# Patient Record
Sex: Female | Born: 1980 | Race: White | Hispanic: No | Marital: Single | Smoking: Former smoker
Health system: Southern US, Community
[De-identification: ages and names within clinical notes are randomized; demographics above are authoritative.]

## PROBLEM LIST (undated history)

## (undated) DIAGNOSIS — F419 Anxiety disorder, unspecified: Secondary | ICD-10-CM

## (undated) HISTORY — DX: Anxiety disorder, unspecified: F41.9

---

## 2011-05-05 ENCOUNTER — Emergency Department: Payer: Self-pay | Admitting: Emergency Medicine

## 2011-06-17 ENCOUNTER — Emergency Department: Payer: Self-pay | Admitting: *Deleted

## 2011-06-20 ENCOUNTER — Ambulatory Visit: Payer: Self-pay | Admitting: Urology

## 2011-07-25 ENCOUNTER — Ambulatory Visit: Payer: Self-pay | Admitting: Urology

## 2011-07-26 ENCOUNTER — Ambulatory Visit: Payer: Self-pay | Admitting: Urology

## 2011-08-01 ENCOUNTER — Ambulatory Visit: Payer: Self-pay | Admitting: Urology

## 2013-09-12 IMAGING — CR DG ABDOMEN 1V
1 series · 1 of 1 positions shown · non-contrast
Comparison: none

REASON FOR EXAM: nephrolithiasis
COMMENTS:

PROCEDURE:     DXR - DXR KIDNEY URETER BLADDER  - July 26, 2011  [DATE]
RESULT:     Comparison: 06/20/2011

[supine kub]
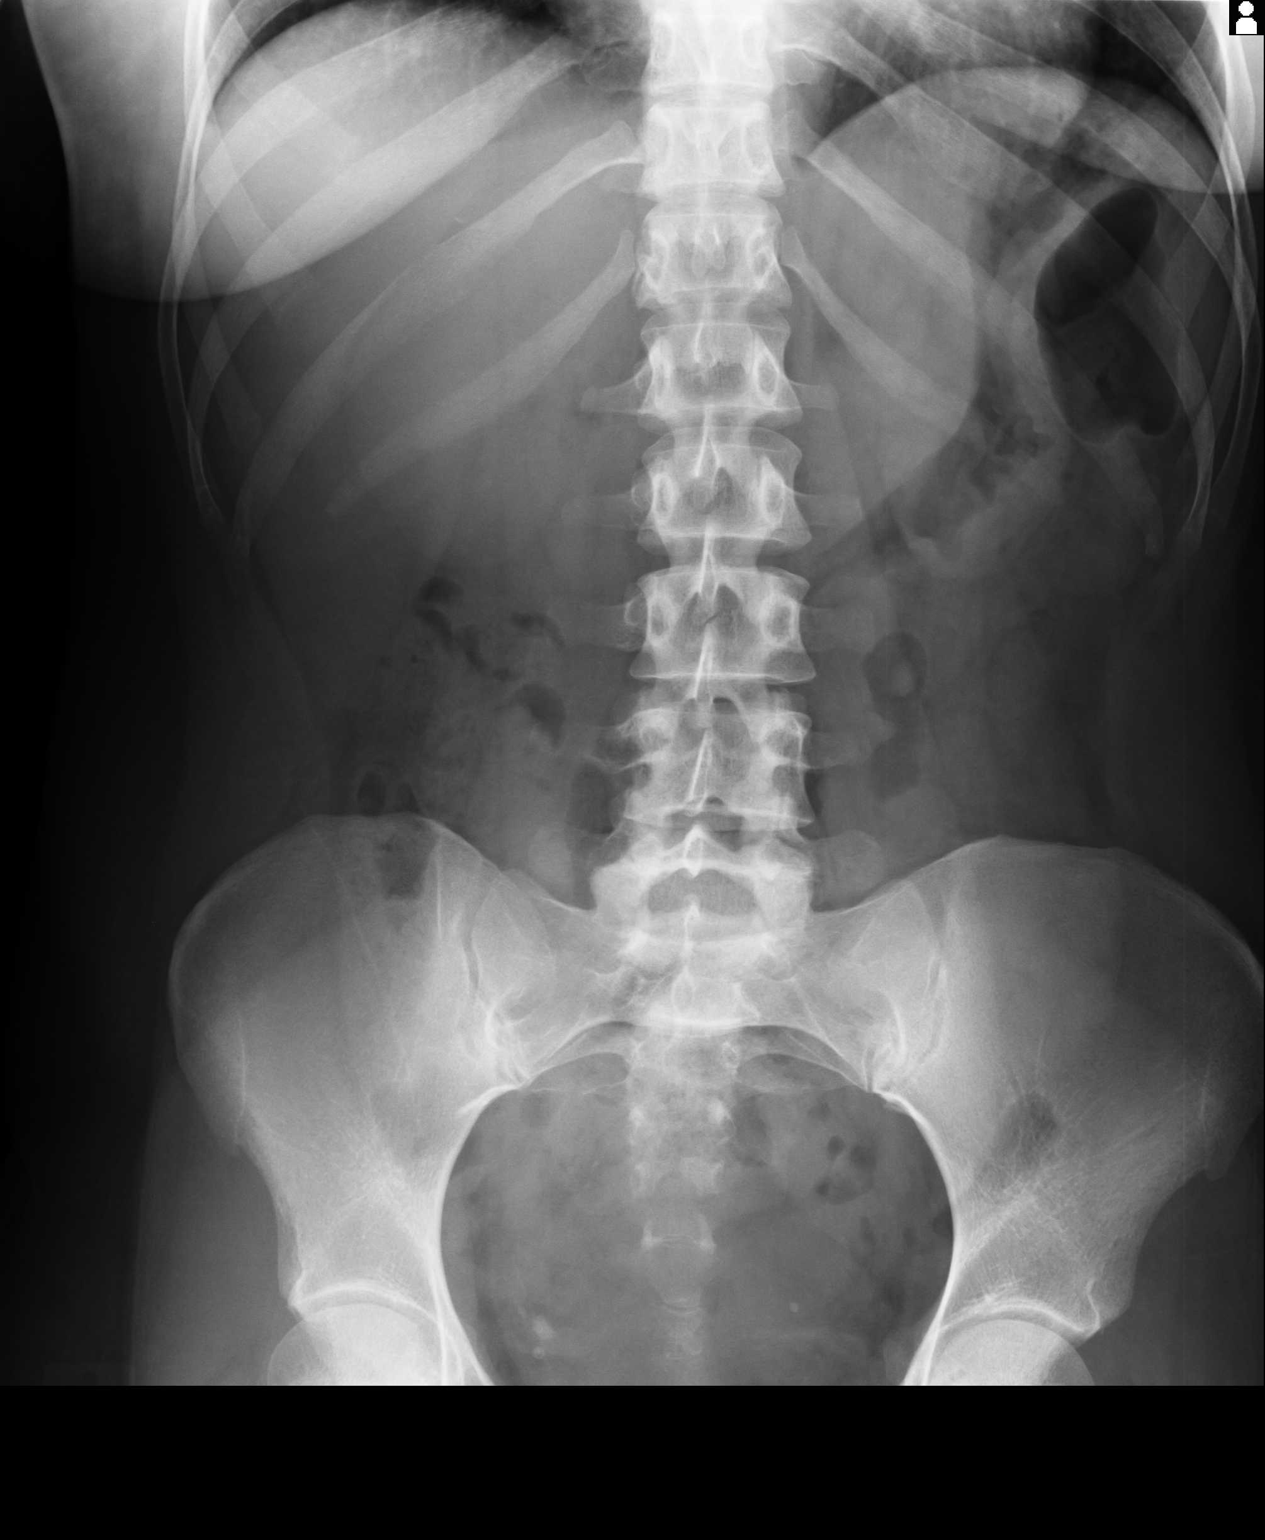

[1 of 1 positions shown; findings below may reference images not displayed]

FINDINGS: There has been slight advancement of the distal ureteral calculus, now
projecting near the ureterovesical junction. Other calcifications in the
pelvis likely represent phleboliths. There may be a tiny calculus in the
inferior pole of the right kidney.
IMPRESSION: Slight interval advancement of the distal right ureteral calculus.

## 2014-12-05 NOTE — Op Note (Signed)
PATIENT NAME:  Denise Sanchez, Denise Sanchez MR#:  [id number] DATE OF BIRTH:  1981/03/28  DATE OF PROCEDURE:  08/01/2011  PREOPERATIVE DIAGNOSIS: Right distal ureteral calculus.   POSTOPERATIVE DIAGNOSIS: Passed ureteral calculus.   PROCEDURES: Right ureteroscopy.   SURGEON: Scott C. Lonna CobbStoioff, MD   ASSISTANT: None.  ANESTHETIC: General.  INDICATION: The patient is a 34 year old female originally seen 06/20/2011 with a 4 x 6 mm right proximal ureteral calculus which was initially diagnosed in September 2012. Because of her work, she has not been able to take off any time and desired ureteroscopic removal. KUB performed last week showed progression of the stone to the distal ureter. She had increased pain yesterday but was not aware of passing a stone.   DESCRIPTION OF PROCEDURE: The patient was taken to the operating room where a general anesthetic was administered. She was placed in the low lithotomy position and her external genitalia were prepped and draped sterilely. Time-out was performed per protocol. A 21 French cystoscope sheath with obturator was lubricated and passed per urethra. Panendoscopy demonstrates normal appearing left ureteral orifice with clear efflux. The right ureteral orifice was edematous, however, efflux was noted. No bladder mucosal lesions were identified. A 0.035 Glidewire was placed with the cystoscope into the right ureteral orifice. Guidewire was passed up to the renal pelvis under fluoroscopic guidance. After passing the guidewire, a stone was not seen on fluoroscopy. The cystoscope was removed and a semirigid ureteroscope was passed per urethra. The right ureteral orifice was easily engaged without dilation. No stone was identified within the distal ureter. The ureteroscope was able to be passed up to the ureteropelvic junction with no abnormalities noted. No stone was seen in the kidney on fluoroscopy. Scope was withdrawn and again no stone or abnormality was noted. Ureteroscope was  removed. Cystoscope was repassed and no stone was seen in the bladder. The guidewire was removed. A ureteral stent was not placed. A B and O suppository was placed per rectum.   The patient was taken to the PAC-U in stable condition. No complications. Estimated blood loss zero.  ____________________________ Verna CzechScott C. Lonna CobbStoioff, MD scs:drc D: 08/01/2011 11:06:12 ET T: 08/01/2011 11:23:58 ET JOB#: [id number]  cc: Lorin PicketScott C. Lonna CobbStoioff, MD, <Dictator> Riki AltesSCOTT C STOIOFF MD ELECTRONICALLY SIGNED 08/29/2011 7:28

## 2024-02-04 ENCOUNTER — Encounter: Payer: Self-pay | Admitting: General Practice

## 2024-02-04 ENCOUNTER — Ambulatory Visit: Admitting: General Practice

## 2024-02-04 VITALS — BP 138/80 | HR 77 | Temp 98.2°F | Ht 61.5 in | Wt 148.0 lb

## 2024-02-04 DIAGNOSIS — Z833 Family history of diabetes mellitus: Secondary | ICD-10-CM | POA: Insufficient documentation

## 2024-02-04 DIAGNOSIS — Z7689 Persons encountering health services in other specified circumstances: Secondary | ICD-10-CM | POA: Insufficient documentation

## 2024-02-04 DIAGNOSIS — Z1159 Encounter for screening for other viral diseases: Secondary | ICD-10-CM

## 2024-02-04 DIAGNOSIS — Z0000 Encounter for general adult medical examination without abnormal findings: Secondary | ICD-10-CM | POA: Diagnosis not present

## 2024-02-04 DIAGNOSIS — Z114 Encounter for screening for human immunodeficiency virus [HIV]: Secondary | ICD-10-CM | POA: Diagnosis not present

## 2024-02-04 DIAGNOSIS — Z124 Encounter for screening for malignant neoplasm of cervix: Secondary | ICD-10-CM | POA: Insufficient documentation

## 2024-02-04 DIAGNOSIS — Z1231 Encounter for screening mammogram for malignant neoplasm of breast: Secondary | ICD-10-CM

## 2024-02-04 LAB — LIPID PANEL
Cholesterol: 230 mg/dL — ABNORMAL HIGH (ref 0–200)
HDL: 71.1 mg/dL (ref >39.00)
LDL Cholesterol: 146 mg/dL — ABNORMAL HIGH (ref 0–99)
NonHDL: 158.65
Total CHOL/HDL Ratio: 3
Triglycerides: 64 mg/dL (ref 0.0–149.0)
VLDL: 12.8 mg/dL (ref 0.0–40.0)

## 2024-02-04 LAB — COMPREHENSIVE METABOLIC PANEL WITH GFR
ALT: 44 U/L — ABNORMAL HIGH (ref 0–35)
AST: 36 U/L (ref 0–37)
Albumin: 4.9 g/dL (ref 3.5–5.2)
Alkaline Phosphatase: 63 U/L (ref 39–117)
BUN: 8 mg/dL (ref 6–23)
CO2: 27 meq/L (ref 19–32)
Calcium: 9.6 mg/dL (ref 8.4–10.5)
Chloride: 102 meq/L (ref 96–112)
Creatinine, Ser: 0.68 mg/dL (ref 0.40–1.20)
GFR: 107.14 mL/min (ref >60.00)
Glucose, Bld: 89 mg/dL (ref 70–99)
Potassium: 4 meq/L (ref 3.5–5.1)
Sodium: 138 meq/L (ref 135–145)
Total Bilirubin: 1.1 mg/dL (ref 0.2–1.2)
Total Protein: 7.6 g/dL (ref 6.0–8.3)

## 2024-02-04 LAB — CBC
HCT: 39.4 % (ref 36.0–46.0)
Hemoglobin: 13.2 g/dL (ref 12.0–15.0)
MCHC: 33.6 g/dL (ref 30.0–36.0)
MCV: 84.1 fl (ref 78.0–100.0)
Platelets: 389 10*3/uL (ref 150.0–400.0)
RBC: 4.68 Mil/uL (ref 3.87–5.11)
RDW: 14 % (ref 11.5–15.5)
WBC: 8.3 10*3/uL (ref 4.0–10.5)

## 2024-02-04 LAB — TSH: TSH: 1.88 u[IU]/mL (ref 0.35–5.50)

## 2024-02-04 LAB — HEMOGLOBIN A1C: Hgb A1c MFr Bld: 5.3 % (ref 4.6–6.5)

## 2024-02-04 NOTE — Patient Instructions (Addendum)
 Stop by the lab prior to leaving today. I will notify you of your results once received.   You will either be contacted via phone regarding your referral to gynecology,  or you may receive a letter on your MyChart portal from our referral team with instructions for scheduling an appointment. Please let us  know if you have not been contacted by anyone within two weeks.  Call and schedule mammogram appointment.   It was a pleasure to meet you today! Please don't hesitate to contact me with any questions. Welcome to [facility]!

## 2024-02-04 NOTE — Assessment & Plan Note (Signed)
 Immunizations UTD. Pap smear due Mammogram due, orders placed. Colonoscopy  due   Discussed the importance of a healthy diet and regular exercise in order for weight loss, and to reduce the risk of further co-morbidity.  Exam stable. Labs pending.  Follow up in 1 year for repeat physical.

## 2024-02-04 NOTE — Assessment & Plan Note (Signed)
 Referral placed for gyn for pap smear.

## 2024-02-04 NOTE — Progress Notes (Signed)
 New Patient Office Visit  Subjective    Patient ID: Denise Sanchez, female    DOB: 10/07/1980  Age: 43 y.o. MRN: [id number]  CC:  Chief Complaint  Patient presents with   New Patient (Initial Visit)   Annual Exam    HPI Denise Sanchez is a 43 y.o. female presents to establish care, complete physical and follow up of chronic conditions..  Last PCP/physical/labs: several years ago; never had any blood work.   Immunizations: -Tetanus: Completed in 2024 -Influenza: never  Diet: Fair diet.  Exercise:  regular exercise - 30 mins a day with indoor bike, treadmill 4-5 times day.   Eye exam: Completes annually  Dental exam: Completes semi-annually    Pap Smear: never Mammogram: never  Family hx of rectal cancer with maternal grandmother.  Family hx of type 2 DM on maternal side.   Outpatient Encounter Medications as of 02/04/2024  Medication Sig   levocetirizine (XYZAL) 5 MG tablet Take 5 mg by mouth every evening.   Multiple Vitamin (MULTI VITAMIN PO) Take by mouth.   omeprazole (PRILOSEC) 20 MG capsule Take 20 mg by mouth daily.   Probiotic Product (PROBIOTIC BLEND PO) Take by mouth.   No facility-administered encounter medications on file as of 02/04/2024.    Past Medical History:  Diagnosis Date   Anxiety     History reviewed. No pertinent surgical history.  Family History  Problem Relation Age of Onset   Alcohol abuse Father    Alcohol abuse Maternal Grandfather    Heart disease Maternal Grandfather     Social History   Socioeconomic History   Marital status: Single    Spouse name: Not on file   Number of children: Not on file   Years of education: Not on file   Highest education level: Some college, no degree  Occupational History   Not on file  Tobacco Use   Smoking status: Former    Current packs/day: 0.00    Types: Cigarettes    Quit date: 09/23/2009    Years since quitting: 14.3   Smokeless tobacco: Never  Vaping Use   Vaping status: Never Used   Substance and Sexual Activity   Alcohol use: Not Currently   Drug use: Not Currently    Types: Marijuana   Sexual activity: Not Currently    Birth control/protection: None  Other Topics Concern   Not on file  Social History Narrative   Not on file   Social Drivers of Health   Financial Resource Strain: Low Risk  (02/03/2024)   Overall Financial Resource Strain (CARDIA)    Difficulty of Paying Living Expenses: Not very hard  Food Insecurity: No Food Insecurity (02/03/2024)   Hunger Vital Sign    Worried About Running Out of Food in the Last Year: Never true    Ran Out of Food in the Last Year: Never true  Transportation Needs: No Transportation Needs (02/03/2024)   PRAPARE - Administrator, Civil Service (Medical): No    Lack of Transportation (Non-Medical): No  Physical Activity: Sufficiently Active (02/03/2024)   Exercise Vital Sign    Days of Exercise per Week: 5 days    Minutes of Exercise per Session: 30 min  Stress: Stress Concern Present (02/03/2024)   [employer] of Occupational Health - Occupational Stress Questionnaire    Feeling of Stress: To some extent  Social Connections: Socially Isolated (02/03/2024)   Social Connection and Isolation Panel    Frequency of Communication with Friends  and Family: More than three times a week    Frequency of Social Gatherings with Friends and Family: Once a week    Attends Religious Services: Never    Database administrator or Organizations: No    Attends Engineer, structural: Not on file    Marital Status: Never married  Intimate Partner Violence: Not on file    Review of Systems  Constitutional:  Negative for chills, fever, malaise/fatigue and weight loss.  HENT:  Negative for congestion, ear discharge, ear pain, hearing loss, nosebleeds, sinus pain, sore throat and tinnitus.   Eyes:  Negative for blurred vision, double vision, pain, discharge and redness.  Respiratory:  Negative for cough, shortness of  breath, wheezing and stridor.   Cardiovascular:  Negative for chest pain, palpitations and leg swelling.  Gastrointestinal:  Negative for abdominal pain, constipation, diarrhea, heartburn, nausea and vomiting.  Genitourinary:  Negative for dysuria, frequency and urgency.  Musculoskeletal:  Negative for myalgias.  Skin:  Negative for rash.  Neurological:  Negative for dizziness, tingling, seizures, weakness and headaches.  Psychiatric/Behavioral:  Negative for depression, substance abuse and suicidal ideas. The patient is not nervous/anxious.       Objective    BP 138/80   Pulse 77   Temp 98.2 F (36.8 C) (Oral)   Ht 5' 1.5 (1.562 m)   Wt 148 lb (67.1 kg)   LMP 01/31/2024 (Within Days)   SpO2 98%   BMI 27.51 kg/m   Physical Exam Vitals and nursing note reviewed.  Constitutional:      Appearance: Normal appearance.  HENT:     Head: Normocephalic and atraumatic.     Right Ear: Tympanic membrane, ear canal and external ear normal.     Left Ear: Tympanic membrane, ear canal and external ear normal.     Nose: Nose normal.     Mouth/Throat:     Mouth: Mucous membranes are moist.     Pharynx: Oropharynx is clear.   Eyes:     Conjunctiva/sclera: Conjunctivae normal.     Pupils: Pupils are equal, round, and reactive to light.    Cardiovascular:     Rate and Rhythm: Normal rate and regular rhythm.     Pulses: Normal pulses.     Heart sounds: Normal heart sounds.  Pulmonary:     Effort: Pulmonary effort is normal.     Breath sounds: Normal breath sounds.  Abdominal:     General: Abdomen is flat. Bowel sounds are normal.     Palpations: Abdomen is soft.   Musculoskeletal:        General: Normal range of motion.     Cervical back: Normal range of motion.   Skin:    General: Skin is warm and dry.     Capillary Refill: Capillary refill takes less than 2 seconds.   Neurological:     General: No focal deficit present.     Mental Status: She is alert and oriented to  person, place, and time. Mental status is at baseline.   Psychiatric:        Mood and Affect: Mood normal.        Behavior: Behavior normal.        Thought Content: Thought content normal.        Judgment: Judgment normal.         Assessment & Plan:  Encounter for screening and preventative care Assessment & Plan: Immunizations UTD. Pap smear due Mammogram due, orders placed. Colonoscopy  due  Discussed the importance of a healthy diet and regular exercise in order for weight loss, and to reduce the risk of further co-morbidity.  Exam stable. Labs pending.  Follow up in 1 year for repeat physical.  Orders: -     CBC -     Comprehensive metabolic panel with GFR -     Lipid panel -     TSH  Establishing care with new doctor, encounter for Assessment & Plan: EMR reviewed briefly.    Screening for HIV (human immunodeficiency virus) -     HIV Antibody (routine testing w rflx)  Encounter for hepatitis C screening test for low risk patient -     Hepatitis C antibody  Screening for cervical cancer Assessment & Plan: Referral placed for gyn for pap smear.   Orders: -     Ambulatory referral to Gynecology  Family history of diabetes mellitus (DM) Assessment & Plan: Hemoglobin A1c pending.  Orders: -     Hemoglobin A1c  Encounter for screening mammogram for malignant neoplasm of breast -     3D Screening Mammogram, Left and Right; Future    Return in about 1 year (around 02/03/2025) for physical .   Carrol Aurora, NP

## 2024-02-04 NOTE — Assessment & Plan Note (Signed)
 EMR reviewed briefly.

## 2024-02-04 NOTE — Assessment & Plan Note (Signed)
 Hemoglobin A1c pending

## 2024-02-05 ENCOUNTER — Ambulatory Visit: Payer: Self-pay | Admitting: General Practice

## 2024-02-05 LAB — HIV ANTIBODY (ROUTINE TESTING W REFLEX): HIV 1&2 Ab, 4th Generation: NONREACTIVE

## 2024-02-05 LAB — HEPATITIS C ANTIBODY: Hepatitis C Ab: NONREACTIVE

## 2024-02-07 NOTE — Telephone Encounter (Signed)
 Spoke to pt, sch labs for 05/11/24 & ov for 08/17/24

## 2024-03-15 NOTE — Progress Notes (Unsigned)
 Vincente Shivers, NP   No chief complaint on file.   HPI:      Ms. Denise Sanchez is a 43 y.o. No obstetric history on file. whose LMP was No LMP recorded., presents today for NP pap smear only, referred by PCP. Annual done with PCP.   Order for mammo placed with PCP  Patient Active Problem List   Diagnosis Date Noted   Establishing care with new doctor, encounter for 02/04/2024   Encounter for screening and preventative care 02/04/2024   Screening for cervical cancer 02/04/2024   Family history of diabetes mellitus (DM) 02/04/2024    No past surgical history on file.  Family History  Problem Relation Age of Onset   Alcohol abuse Father    Alcohol abuse Maternal Grandfather    Heart disease Maternal Grandfather     Social History   Socioeconomic History   Marital status: Single    Spouse name: Not on file   Number of children: Not on file   Years of education: Not on file   Highest education level: Some college, no degree  Occupational History   Not on file  Tobacco Use   Smoking status: Former    Current packs/day: 0.00    Types: Cigarettes    Quit date: 09/23/2009    Years since quitting: 14.4   Smokeless tobacco: Never  Vaping Use   Vaping status: Never Used  Substance and Sexual Activity   Alcohol use: Not Currently   Drug use: Not Currently    Types: Marijuana   Sexual activity: Not Currently    Birth control/protection: None  Other Topics Concern   Not on file  Social History Narrative   Not on file   Social Drivers of Health   Financial Resource Strain: Low Risk  (02/03/2024)   Overall Financial Resource Strain (CARDIA)    Difficulty of Paying Living Expenses: Not very hard  Food Insecurity: No Food Insecurity (02/03/2024)   Hunger Vital Sign    Worried About Running Out of Food in the Last Year: Never true    Ran Out of Food in the Last Year: Never true  Transportation Needs: No Transportation Needs (02/03/2024)   PRAPARE - Therapist, art (Medical): No    Lack of Transportation (Non-Medical): No  Physical Activity: Sufficiently Active (02/03/2024)   Exercise Vital Sign    Days of Exercise per Week: 5 days    Minutes of Exercise per Session: 30 min  Stress: Stress Concern Present (02/03/2024)   [employer] of Occupational Health - Occupational Stress Questionnaire    Feeling of Stress: To some extent  Social Connections: Socially Isolated (02/03/2024)   Social Connection and Isolation Panel    Frequency of Communication with Friends and Family: More than three times a week    Frequency of Social Gatherings with Friends and Family: Once a week    Attends Religious Services: Never    Database administrator or Organizations: No    Attends Engineer, structural: Not on file    Marital Status: Never married  Intimate Partner Violence: Not on file    Outpatient Medications Prior to Visit  Medication Sig Dispense Refill   levocetirizine (XYZAL) 5 MG tablet Take 5 mg by mouth every evening.     Multiple Vitamin (MULTI VITAMIN PO) Take by mouth.     omeprazole (PRILOSEC) 20 MG capsule Take 20 mg by mouth daily.     Probiotic Product (  PROBIOTIC BLEND PO) Take by mouth.     No facility-administered medications prior to visit.      ROS:  Review of Systems BREAST: No symptoms   OBJECTIVE:   Vitals:  There were no vitals taken for this visit.  Physical Exam  Results: No results found for this or any previous visit (from the past 24 hours).   Assessment/Plan: No diagnosis found.    No orders of the defined types were placed in this encounter.     No follow-ups on file.  Zamiya Dillard B. Anderson Coppock, PA-C 03/15/2024 9:39 AM

## 2024-03-16 ENCOUNTER — Encounter: Payer: Self-pay | Admitting: Obstetrics and Gynecology

## 2024-03-16 ENCOUNTER — Ambulatory Visit: Admitting: Obstetrics and Gynecology

## 2024-03-16 ENCOUNTER — Other Ambulatory Visit (HOSPITAL_COMMUNITY)
Admission: RE | Admit: 2024-03-16 | Discharge: 2024-03-16 | Disposition: A | Discharge status: Home / Self Care | Source: Ambulatory Visit | Attending: Obstetrics and Gynecology | Admitting: Obstetrics and Gynecology

## 2024-03-16 VITALS — BP 130/85 | HR 76 | Ht 61.5 in | Wt 143.0 lb

## 2024-03-16 DIAGNOSIS — Z124 Encounter for screening for malignant neoplasm of cervix: Secondary | ICD-10-CM | POA: Insufficient documentation

## 2024-03-16 DIAGNOSIS — F3281 Premenstrual dysphoric disorder: Secondary | ICD-10-CM

## 2024-03-16 DIAGNOSIS — Z1151 Encounter for screening for human papillomavirus (HPV): Secondary | ICD-10-CM | POA: Insufficient documentation

## 2024-03-16 DIAGNOSIS — N939 Abnormal uterine and vaginal bleeding, unspecified: Secondary | ICD-10-CM | POA: Diagnosis not present

## 2024-03-16 NOTE — Patient Instructions (Signed)
 I value your feedback and you entrusting [place] with your care. If you get a [facility] patient survey, I would appreciate you taking the time to let us know about your experience today. Thank you! ? ? ?

## 2024-03-19 LAB — CYTOLOGY - PAP
Comment: NEGATIVE
Diagnosis: NEGATIVE
High risk HPV: NEGATIVE

## 2024-04-07 ENCOUNTER — Other Ambulatory Visit

## 2024-04-29 ENCOUNTER — Other Ambulatory Visit: Payer: Self-pay | Admitting: General Practice

## 2024-04-29 ENCOUNTER — Ambulatory Visit

## 2024-04-29 DIAGNOSIS — N939 Abnormal uterine and vaginal bleeding, unspecified: Secondary | ICD-10-CM | POA: Diagnosis not present

## 2024-04-29 DIAGNOSIS — R7989 Other specified abnormal findings of blood chemistry: Secondary | ICD-10-CM

## 2024-04-30 ENCOUNTER — Telehealth: Payer: Self-pay | Admitting: Obstetrics and Gynecology

## 2024-04-30 DIAGNOSIS — N760 Acute vaginitis: Secondary | ICD-10-CM

## 2024-04-30 MED ORDER — METRONIDAZOLE 500 MG PO TABS: ORAL_TABLET | ORAL | 0 refills | Status: DC

## 2024-04-30 NOTE — Telephone Encounter (Signed)
 Pt aware of Gyn u/s results and leio. Tx options discussed, pt wants to follow for now.  Also discussed BV flora shift on pap smear; has since started to have vag d/c and odor. Rx flagyl  eRxd, no EtOH. F/u prn.

## 2024-05-11 ENCOUNTER — Ambulatory Visit: Payer: Self-pay | Admitting: General Practice

## 2024-05-11 ENCOUNTER — Other Ambulatory Visit

## 2024-05-11 DIAGNOSIS — R7989 Other specified abnormal findings of blood chemistry: Secondary | ICD-10-CM

## 2024-05-11 LAB — HEPATIC FUNCTION PANEL
ALT: 13 U/L (ref 0–35)
AST: 15 U/L (ref 0–37)
Albumin: 4.5 g/dL (ref 3.5–5.2)
Alkaline Phosphatase: 59 U/L (ref 39–117)
Bilirubin, Direct: 0.2 mg/dL (ref 0.0–0.3)
Total Bilirubin: 1 mg/dL (ref 0.2–1.2)
Total Protein: 7.2 g/dL (ref 6.0–8.3)

## 2024-07-01 ENCOUNTER — Telehealth: Payer: Self-pay

## 2024-07-01 DIAGNOSIS — N939 Abnormal uterine and vaginal bleeding, unspecified: Secondary | ICD-10-CM

## 2024-07-01 DIAGNOSIS — F3281 Premenstrual dysphoric disorder: Secondary | ICD-10-CM

## 2024-07-01 MED ORDER — DROSPIRENONE-ETHINYL ESTRADIOL 3-0.02 MG PO TABS: 1.0000 | ORAL_TABLET | Freq: Every day | ORAL | 3 refills | Status: AC

## 2024-07-01 NOTE — Telephone Encounter (Signed)
 Patient called she is considering Slynd as an options for treatment for abnormal uterine bleeding. She would like to discuss options with you. She would like to do a telephone visit if necessary, she is unsure if Slynd is right for her, she does not want anything that is going to make her gain weight.

## 2024-07-01 NOTE — Telephone Encounter (Signed)
 Spoke with pt. Will start Rx yaz, no contraindications to OCPs. Will do better for cycle control and PMDD sx. Rx eRxd, start with next menses, not sexually active. F/u prn.

## 2024-08-17 ENCOUNTER — Ambulatory Visit: Payer: Self-pay | Admitting: General Practice

## 2024-08-17 ENCOUNTER — Ambulatory Visit: Admitting: General Practice

## 2024-08-17 ENCOUNTER — Encounter: Payer: Self-pay | Admitting: General Practice

## 2024-08-17 VITALS — BP 138/100 | HR 90 | Temp 98.6°F | Ht 61.5 in | Wt 143.6 lb

## 2024-08-17 DIAGNOSIS — R7989 Other specified abnormal findings of blood chemistry: Secondary | ICD-10-CM | POA: Diagnosis not present

## 2024-08-17 DIAGNOSIS — E782 Mixed hyperlipidemia: Secondary | ICD-10-CM

## 2024-08-17 DIAGNOSIS — R5383 Other fatigue: Secondary | ICD-10-CM | POA: Diagnosis not present

## 2024-08-17 DIAGNOSIS — R030 Elevated blood-pressure reading, without diagnosis of hypertension: Secondary | ICD-10-CM | POA: Insufficient documentation

## 2024-08-17 LAB — HEMOGLOBIN A1C: Hgb A1c MFr Bld: 5.2 % (ref 4.6–6.5)

## 2024-08-17 LAB — COMPREHENSIVE METABOLIC PANEL WITH GFR
ALT: 9 U/L (ref 3–35)
AST: 13 U/L (ref 5–37)
Albumin: 4.5 g/dL (ref 3.5–5.2)
Alkaline Phosphatase: 43 U/L (ref 39–117)
BUN: 11 mg/dL (ref 6–23)
CO2: 26 meq/L (ref 19–32)
Calcium: 9.1 mg/dL (ref 8.4–10.5)
Chloride: 106 meq/L (ref 96–112)
Creatinine, Ser: 0.68 mg/dL (ref 0.40–1.20)
GFR: 106.74 mL/min
Glucose, Bld: 88 mg/dL (ref 70–99)
Potassium: 4.4 meq/L (ref 3.5–5.1)
Sodium: 139 meq/L (ref 135–145)
Total Bilirubin: 0.7 mg/dL (ref 0.2–1.2)
Total Protein: 7.4 g/dL (ref 6.0–8.3)

## 2024-08-17 LAB — VITAMIN B12: Vitamin B-12: 232 pg/mL (ref 211–911)

## 2024-08-17 LAB — VITAMIN D 25 HYDROXY (VIT D DEFICIENCY, FRACTURES): VITD: 37.2 ng/mL (ref 30.00–100.00)

## 2024-08-17 LAB — CBC
HCT: 37.5 % (ref 36.0–46.0)
Hemoglobin: 12.7 g/dL (ref 12.0–15.0)
MCHC: 33.8 g/dL (ref 30.0–36.0)
MCV: 85 fl (ref 78.0–100.0)
Platelets: 352 K/uL (ref 150.0–400.0)
RBC: 4.42 Mil/uL (ref 3.87–5.11)
RDW: 14.2 % (ref 11.5–15.5)
WBC: 7.5 K/uL (ref 4.0–10.5)

## 2024-08-17 LAB — LIPID PANEL
Cholesterol: 220 mg/dL — ABNORMAL HIGH (ref 28–200)
HDL: 71 mg/dL
LDL Cholesterol: 129 mg/dL — ABNORMAL HIGH (ref 10–99)
NonHDL: 149.49
Total CHOL/HDL Ratio: 3
Triglycerides: 104 mg/dL (ref 10.0–149.0)
VLDL: 20.8 mg/dL (ref 0.0–40.0)

## 2024-08-17 LAB — TSH: TSH: 0.53 u[IU]/mL (ref 0.35–5.50)

## 2024-08-17 NOTE — Progress Notes (Signed)
 "  Established Patient Office Visit  Subjective   Patient ID: Denise Sanchez, female    DOB: 1981-05-14  Age: 44 y.o. MRN: [id number]  Chief Complaint  Patient presents with   Hyperlipidemia    Patient here to follow up on cholesterol with fasting labs    Hyperlipidemia Pertinent negatives include no chest pain or shortness of breath.    Discussed the use of AI scribe software for clinical note transcription with the patient, who gave verbal consent to proceed.  History of Present Illness Denise Sanchez is a 44 year old female who presents for follow-up on cholesterol levels and elevated liver function tests.  She has been monitoring her cholesterol levels after they were found to be borderline high during a previous physical exam. Her total cholesterol was 230 mg/dL, with an LDL of 853 mg/dL, HDL of 71 mg/dL, and triglycerides of 64 mg/dL. She attempted dietary changes, including going gluten-free and dairy-free, and reducing sugar intake, but found it challenging to maintain these changes, especially during the holiday season. She feels unwell, has gained weight, experiences poor sleep, and is out of breath due to a lack of exercise.  Her liver function tests previously showed an elevated ALT, which returned to normal upon recheck. She does not consume alcohol and rarely uses ibuprofen, having taken it only twice since her last visit.   She experiences fatigue, poor sleep, and night sweats, which she attributes to hormonal changes. She started a new birth control pill, Yaz, in November, which she believes may be contributing to her symptoms. She experiences sleep disturbances, waking up after two hours and sweating profusely.  She is currently taking over-the-counter vitamin D  daily.     Patient Active Problem List   Diagnosis Date Noted   Elevated blood pressure reading 08/17/2024   Mixed hyperlipidemia 08/17/2024   Abnormal LFTs 08/17/2024   Fatigue 08/17/2024   Establishing care  with new doctor, encounter for 02/04/2024   Encounter for screening and preventative care 02/04/2024   Screening for cervical cancer 02/04/2024   Family history of diabetes mellitus (DM) 02/04/2024   Past Medical History:  Diagnosis Date   Anxiety    Past Surgical History:  Procedure Laterality Date   OTHER SURGICAL HISTORY     Oral Surgery   Allergies[1]       08/17/2024    9:35 AM 02/04/2024    2:30 PM  Depression screen PHQ 2/9  Decreased Interest 0 0  Down, Depressed, Hopeless 0 1  PHQ - 2 Score 0 1  Altered sleeping 3 1  Tired, decreased energy 1 1  Change in appetite 0 0  Feeling bad or failure about yourself  0 0  Trouble concentrating 0 0  Moving slowly or fidgety/restless 0 0  Suicidal thoughts 0 0  PHQ-9 Score 4 3   Difficult doing work/chores Somewhat difficult Not difficult at all     Data saved with a previous flowsheet row definition       08/17/2024    9:36 AM 02/04/2024    2:31 PM  GAD 7 : Generalized Anxiety Score  Nervous, Anxious, on Edge 1 1  Control/stop worrying 1 1  Worry too much - different things 1 1  Trouble relaxing 1 1  Restless 0 0  Easily annoyed or irritable 1 1  Afraid - awful might happen 0 0  Total GAD 7 Score 5 5  Anxiety Difficulty Somewhat difficult Not difficult at all      Review of  Systems  Constitutional:  Negative for chills and fever.  Respiratory:  Negative for shortness of breath.   Cardiovascular:  Negative for chest pain.  Gastrointestinal:  Negative for abdominal pain, constipation, diarrhea, heartburn, nausea and vomiting.  Genitourinary:  Negative for dysuria, frequency and urgency.  Neurological:  Negative for dizziness and headaches.  Endo/Heme/Allergies:  Negative for polydipsia.  Psychiatric/Behavioral:  Negative for depression and suicidal ideas. The patient is not nervous/anxious.       Objective:     BP (!) 138/100   Pulse 90   Temp 98.6 F (37 C) (Temporal)   Ht 5' 1.5 (1.562 m)   Wt 143 lb  9.6 oz (65.1 kg)   LMP 08/14/2024 (Exact Date)   SpO2 99%   BMI 26.69 kg/m  BP Readings from Last 3 Encounters:  08/17/24 (!) 138/100  03/16/24 130/85  02/04/24 138/80   Wt Readings from Last 3 Encounters:  08/17/24 143 lb 9.6 oz (65.1 kg)  03/16/24 143 lb (64.9 kg)  02/04/24 148 lb (67.1 kg)      Physical Exam Vitals and nursing note reviewed.  Constitutional:      Appearance: Normal appearance.  Cardiovascular:     Rate and Rhythm: Normal rate and regular rhythm.     Pulses: Normal pulses.     Heart sounds: Normal heart sounds.  Pulmonary:     Effort: Pulmonary effort is normal.     Breath sounds: Normal breath sounds.  Neurological:     Mental Status: She is alert and oriented to person, place, and time.  Psychiatric:        Mood and Affect: Mood normal.        Behavior: Behavior normal.        Thought Content: Thought content normal.        Judgment: Judgment normal.      Results for orders placed or performed in visit on 08/17/24  Lipid panel  Result Value Ref Range   Cholesterol 220 (H) 28 - 200 mg/dL   Triglycerides 895.9 89.9 - 149.0 mg/dL   HDL 28.99 >60.99 mg/dL   VLDL 79.1 0.0 - 59.9 mg/dL   LDL Cholesterol 870 (H) 10 - 99 mg/dL   Total CHOL/HDL Ratio 3    NonHDL 149.49   VITAMIN D  25 Hydroxy (Vit-D Deficiency, Fractures)  Result Value Ref Range   VITD 37.20 30.00 - 100.00 ng/mL  TSH  Result Value Ref Range   TSH 0.53 0.35 - 5.50 uIU/mL  Hemoglobin A1c  Result Value Ref Range   Hgb A1c MFr Bld 5.2 4.6 - 6.5 %  Vitamin B12  Result Value Ref Range   Vitamin B-12 232 211 - 911 pg/mL  CBC  Result Value Ref Range   WBC 7.5 4.0 - 10.5 K/uL   RBC 4.42 3.87 - 5.11 Mil/uL   Platelets 352.0 150.0 - 400.0 K/uL   Hemoglobin 12.7 12.0 - 15.0 g/dL   HCT 62.4 63.9 - 53.9 %   MCV 85.0 78.0 - 100.0 fl   MCHC 33.8 30.0 - 36.0 g/dL   RDW 85.7 88.4 - 84.4 %  Comprehensive metabolic panel with GFR  Result Value Ref Range   Sodium 139 135 - 145 mEq/L    Potassium 4.4 3.5 - 5.1 mEq/L   Chloride 106 96 - 112 mEq/L   CO2 26 19 - 32 mEq/L   Glucose, Bld 88 70 - 99 mg/dL   BUN 11 6 - 23 mg/dL   Creatinine, Ser 9.31 0.40 -  1.20 mg/dL   Total Bilirubin 0.7 0.2 - 1.2 mg/dL   Alkaline Phosphatase 43 39 - 117 U/L   AST 13 5 - 37 U/L   ALT 9 3 - 35 U/L   Total Protein 7.4 6.0 - 8.3 g/dL   Albumin 4.5 3.5 - 5.2 g/dL   GFR 893.25 >39.99 mL/min   Calcium 9.1 8.4 - 10.5 mg/dL       The 89-bzjm ASCVD risk score (Arnett DK, et al., 2019) is: 0.6%    Assessment & Plan:  Mixed hyperlipidemia -     Lipid panel  Abnormal LFTs  Fatigue, unspecified type -     VITAMIN D  25 Hydroxy (Vit-D Deficiency, Fractures) -     TSH -     Hemoglobin A1c -     Vitamin B12 -     CBC -     Comprehensive metabolic panel with GFR  Elevated blood pressure reading    Assessment and Plan Assessment & Plan Mixed hyperlipidemia LDL elevated at 146 mg/dL, total cholesterol borderline high at 230 mg/dL. HDL excellent, triglycerides normal. Prefers dietary management over medication. - Repeated fasting lipid panel. - Continue dietary modifications to lower LDL. - Consider dietitian referral if dietary management is challenging.  Abnormal liver function tests Previous ALT elevation normalized. No alcohol or excessive acetaminophen use. Possible dietary factor involvement. - Rechecked liver function tests to ensure ALT remains normal. - Consider liver ultrasound if ALT remains elevated.  Fatigue Fatigue, poor sleep, weight gain possibly due to hormonal changes or dietary habits. On Yaz, vitamin D  supplementation ongoing. Differential includes hormonal imbalance, vitamin deficiencies. - Checked A1c for prediabetes. - Checked vitamin D  and B12 levels. - Continue vitamin D  supplementation. - Discuss symptoms with OB/GYN for hormonal evaluation.  Elevated blood pressure First-time elevation, possibly due to anxiety or stress. Previous readings slightly elevated  but not concerning. - both readings elevated today.  - Patient is asked to monitor BP at home or work, several times per month and return with written values at next office visit.  - Advised on dietary sodium reduction. - Consider dietitian referral if needed.    Return in about 2 weeks (around 08/31/2024) for blood pressure. SABRA Carrol Aurora, NP     [1] No Known Allergies  "

## 2024-08-17 NOTE — Patient Instructions (Addendum)
 Stop by the lab prior to leaving today. I will notify you of your results once received.   Start monitoring your blood pressure daily, around the same time of day, for the next 2-3 weeks.  Ensure that you have rested for 30 minutes prior to checking your blood pressure.   Record your readings and notify me if you see numbers consistently at or above 130 on top and/or 90 on bottom.   Continue to monitor diet and eat a low fat and low cholesterol.   Follow up in 2 weeks for blood pressure.  It was a pleasure to see you today!

## 2024-09-01 ENCOUNTER — Ambulatory Visit: Admitting: General Practice

## 2024-09-01 DIAGNOSIS — R030 Elevated blood-pressure reading, without diagnosis of hypertension: Secondary | ICD-10-CM

## 2024-09-10 ENCOUNTER — Ambulatory Visit: Admitting: General Practice

## 2024-09-10 ENCOUNTER — Encounter: Payer: Self-pay | Admitting: General Practice

## 2024-09-10 VITALS — BP 142/98 | HR 86 | Temp 98.0°F | Ht 61.5 in | Wt 141.2 lb

## 2024-09-10 DIAGNOSIS — R030 Elevated blood-pressure reading, without diagnosis of hypertension: Secondary | ICD-10-CM

## 2024-09-10 NOTE — Progress Notes (Signed)
 "  Established Patient Office Visit  Subjective   Patient ID: Denise Sanchez, female    DOB: 07-27-1981  Age: 44 y.o. MRN: [id number]  Chief Complaint  Patient presents with   Hypertension    Patient here to follow up on BP; BP has still been up and down. Not currently taking medication for BP.     Hypertension Pertinent negatives include no chest pain, headaches or shortness of breath.   Discussed the use of AI scribe software for clinical note transcription with the patient, who gave verbal consent to proceed.  History of Present Illness Denise Sanchez is a 44 year old female who presents with concerns about elevated blood pressure readings in clinical settings.  She experiences elevated blood pressure readings during medical appointments, with a recent reading of 160/98 mmHg. She feels anxious prior to and during these visits, which she believes contributes to the elevated readings. At home, her blood pressure readings range from the low 110s to low 130s systolic, with diastolic readings in the 70s to 80s.  She experiences anxiety related to medical appointments, stating that she feels nervous and her heart races hours before the appointment. She did not sleep well the night before the current appointment due to anxiety.  There is a family history of hypertension, with her mother and sister both on blood pressure medication.   She recently visited a dentist where her blood pressure was recorded at 140/106 mmHg.   She denies being a smoker. No headaches, blurred vision, or one-sided body weakness.     Patient Active Problem List   Diagnosis Date Noted   White coat syndrome without diagnosis of hypertension 08/17/2024   Mixed hyperlipidemia 08/17/2024   Abnormal LFTs 08/17/2024   Fatigue 08/17/2024   Establishing care with new doctor, encounter for 02/04/2024   Encounter for screening and preventative care 02/04/2024   Screening for cervical cancer 02/04/2024   Family history of  diabetes mellitus (DM) 02/04/2024   Past Medical History:  Diagnosis Date   Anxiety    Past Surgical History:  Procedure Laterality Date   OTHER SURGICAL HISTORY     Oral Surgery   Allergies[1]       08/17/2024    9:35 AM 02/04/2024    2:30 PM  Depression screen PHQ 2/9  Decreased Interest 0 0  Down, Depressed, Hopeless 0 1  PHQ - 2 Score 0 1  Altered sleeping 3 1  Tired, decreased energy 1 1  Change in appetite 0 0  Feeling bad or failure about yourself  0 0  Trouble concentrating 0 0  Moving slowly or fidgety/restless 0 0  Suicidal thoughts 0 0  PHQ-9 Score 4 3   Difficult doing work/chores Somewhat difficult Not difficult at all     Data saved with a previous flowsheet row definition       08/17/2024    9:36 AM 02/04/2024    2:31 PM  GAD 7 : Generalized Anxiety Score  Nervous, Anxious, on Edge 1  1   Control/stop worrying 1  1   Worry too much - different things 1  1   Trouble relaxing 1  1   Restless 0  0   Easily annoyed or irritable 1  1   Afraid - awful might happen 0  0   Total GAD 7 Score 5 5  Anxiety Difficulty Somewhat difficult Not difficult at all     Data saved with a previous flowsheet row definition  Review of Systems  Constitutional:  Negative for chills and fever.  Respiratory:  Negative for shortness of breath.   Cardiovascular:  Negative for chest pain.  Gastrointestinal:  Negative for abdominal pain, constipation, diarrhea, heartburn, nausea and vomiting.  Genitourinary:  Negative for dysuria, frequency and urgency.  Neurological:  Negative for dizziness and headaches.  Endo/Heme/Allergies:  Negative for polydipsia.  Psychiatric/Behavioral:  Negative for depression and suicidal ideas. The patient is not nervous/anxious.       Objective:     BP (!) 142/98   Pulse 86   Temp 98 F (36.7 C) (Temporal)   Ht 5' 1.5 (1.562 m)   Wt 141 lb 3.2 oz (64 kg)   LMP 08/14/2024 (Exact Date)   SpO2 100%   BMI 26.25 kg/m  BP Readings  from Last 3 Encounters:  09/10/24 (!) 142/98  08/17/24 (!) 138/100  03/16/24 130/85   Wt Readings from Last 3 Encounters:  09/10/24 141 lb 3.2 oz (64 kg)  08/17/24 143 lb 9.6 oz (65.1 kg)  03/16/24 143 lb (64.9 kg)      Physical Exam Vitals and nursing note reviewed.  Constitutional:      Appearance: Normal appearance.  Cardiovascular:     Rate and Rhythm: Normal rate and regular rhythm.     Pulses: Normal pulses.     Heart sounds: Normal heart sounds.  Pulmonary:     Effort: Pulmonary effort is normal.     Breath sounds: Normal breath sounds.  Neurological:     Mental Status: She is alert and oriented to person, place, and time.  Psychiatric:        Mood and Affect: Mood normal.        Behavior: Behavior normal.        Thought Content: Thought content normal.        Judgment: Judgment normal.      No results found for any visits on 09/10/24.     The 10-year ASCVD risk score (Arnett DK, et al., 2019) is: 0.7%    Assessment & Plan:  Elevated blood pressure reading  White coat syndrome without diagnosis of hypertension   Assessment and Plan Assessment & Plan Elevated blood pressure reading, possible white coat syndrome Home readings normal; in-office elevated, suggesting white coat syndrome.  - given home readings all range between 110-120s systolic and 70s-80s diastolic, suspect white coat syndrome.  - Report consistent home readings above 130/90 mmHg. - Seek emergency care if experiencing symptoms of stroke. Discussed at length.  Mixed hyperlipidemia Cholesterol levels improved; LDL and total cholesterol elevated, HDL good. - Continue current management and lifestyle modifications.     Return in about 5 months (around 02/08/2025) for physical and fasting labs.SABRA Carrol Aurora, NP    [1] No Known Allergies  "

## 2024-09-10 NOTE — Patient Instructions (Addendum)
 Continue monitoring BP at home.   Report any consistent readings above 130/90.  Follow up in 6 months for fasting labs and physical.  It was a pleasure to see you today!

## 2025-02-05 ENCOUNTER — Ambulatory Visit: Admitting: General Practice

## 2025-02-05 ENCOUNTER — Encounter: Admitting: General Practice
